# Patient Record
Sex: Male | Born: 2005 | Race: White | Hispanic: Yes | Marital: Single | Smoking: Never smoker
Health system: Southern US, Community
[De-identification: ages and names within clinical notes are randomized; demographics above are authoritative.]

## PROBLEM LIST (undated history)

## (undated) DIAGNOSIS — F419 Anxiety disorder, unspecified: Secondary | ICD-10-CM

## (undated) HISTORY — DX: Anxiety disorder, unspecified: F41.9

---
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
## 2023-02-05 ENCOUNTER — Ambulatory Visit (INDEPENDENT_AMBULATORY_CARE_PROVIDER_SITE_OTHER): Payer: Medicaid Other | Admitting: Pediatrics

## 2023-02-05 VITALS — Temp 98.4°F | Wt 176.4 lb

## 2023-02-05 DIAGNOSIS — R051 Acute cough: Secondary | ICD-10-CM | POA: Diagnosis not present

## 2023-02-05 MED ORDER — AZITHROMYCIN 250 MG PO TABS
ORAL_TABLET | ORAL | 0 refills | Status: AC
Start: 2023-02-05 — End: 2023-02-10

## 2023-02-05 NOTE — Patient Instructions (Signed)
We are prescribing azithromycin because you have a close family member with pneumonia and you have similar symptoms.  Take 2 tablets (500 mg total) by mouth daily for 1 day, THEN 1 tablet (250 mg total) daily for 4 days.   Take only Dayquil/Nyquil as per instructions on the box, with supplemental Advil if needed. Do not take Tylenol in addition to Dayquil and Nyquil.  Stay hydrated and wash your hands frequently.

## 2023-02-05 NOTE — Progress Notes (Signed)
Subjective:     Carlos Mendez, is a 17 y.o. male   History provider by patient and mother No interpreter necessary.  Chief Complaint  Patient presents with   Cough    Cough , fever , body aches, runny nose, sore throat only in the morning on going 1 week     HPI: Started feeling sick over the weekend. Symptoms include productive cough, congestion, sore throat only in the mornings, L red eye, fevers to 100.15F, body aches. No N/V/D, no ear pain, no rash. Taking Tylneol, Advil, Dayquil, and Nyquil.  Of note, his younger brother Carlos Mendez has been sick for 1 month with URI sx, AOM with ruptured TM, and bacterial conjunctivitis. Carlos Mendez was seen out our office 3 days ago and is taking azithromycin for presumed atypical pneumonia. Younger sister Carlos Mendez also seen in clinic today for 3 weeks of similar symptoms and ear pain. Dad was diagnosed with bronchitis yesterday.  Tryson last had WCC in January. PMH includes eczema. He is UTD on recommended vaccines, though did not get a flu shot this year. He is a Medical laboratory scientific officer at [employer] . Allergic to cats.  Review of Systems  Constitutional:  Positive for activity change, fatigue and fever.  HENT:  Positive for congestion and sore throat. Negative for ear pain.   Eyes:  Positive for redness.  Respiratory:  Positive for chest tightness.   Gastrointestinal:  Negative for abdominal pain, diarrhea, nausea and vomiting.  Genitourinary:  Negative for decreased urine volume, dysuria and enuresis.  Musculoskeletal:  Negative for neck stiffness.  Skin:  Negative for rash.     Patient's history was reviewed and updated as appropriate: allergies, current medications, past family history, past medical history, past social history, past surgical history, and problem list.     Objective:     Temp 98.4 F (36.9 C) (Oral)   Wt 176 lb 6.4 oz (80 kg)   BMI 26.58 kg/m   Physical Exam Vitals reviewed.  Constitutional:      General: He is not in  acute distress.    Appearance: Normal appearance. He is not toxic-appearing.  HENT:     Head: Normocephalic.     Nose: Congestion present.     Mouth/Throat:     Mouth: Mucous membranes are moist.     Pharynx: Posterior oropharyngeal erythema present. No oropharyngeal exudate.  Eyes:     General:        Right eye: No discharge.        Left eye: No discharge.     Extraocular Movements: Extraocular movements intact.     Pupils: Pupils are equal, round, and reactive to light.     Comments: Trace erythema of L conjunctiva  Cardiovascular:     Rate and Rhythm: Normal rate and regular rhythm.     Heart sounds: Normal heart sounds.  Pulmonary:     Effort: Pulmonary effort is normal. No respiratory distress.     Breath sounds: Normal breath sounds. No stridor. No wheezing or rhonchi.  Abdominal:     General: Abdomen is flat. There is no distension.     Palpations: Abdomen is soft.     Tenderness: There is no abdominal tenderness. There is no guarding.  Musculoskeletal:        General: Normal range of motion.     Cervical back: Normal range of motion and neck supple. No tenderness.  Lymphadenopathy:     Cervical: Cervical adenopathy present.  Skin:    General:  Skin is warm and dry.  Neurological:     General: No focal deficit present.  Psychiatric:        Mood and Affect: Mood normal.        Behavior: Behavior normal.       Assessment & Plan:   Acute cough [name] is afebrile with cough, lung exam without focal findings, no wheezes or crackles on exam today. Throat mildly erythematous without tonsillar swelling or exudate, likely in setting of postnasal drip as throat is only sore in AM. One palpable cervical lymph node in L submandibular region. L eye has trace erythema but is without discharge. Presentation is mostly consistent with viral illness; however, given that everyone in the house is sick, his brother is taking azithromycin for atypical pneumonia, and the presence of  mycoplasma pneumonia in the community, we will treat Brynn for CAP and mycoplasma. - Azithromycin 2 tablets (500 mg total) by mouth daily for 1 day, THEN 1 tablet (250 mg total) daily for 4 days.  - Counseled against Tylenol overdose. Recommend taking only Dayquil and Nyquil as directed on package for symptoms with PRN ibuprofen, and discontinuing additional acetaminophen use.  Supportive care and return precautions reviewed.  No follow-ups on file.  Arelia Longest, MD
